# Patient Record
Sex: Male | Born: 1965 | Race: White | Hispanic: No | Marital: Single | Smoking: Former smoker
Health system: Southern US, Community
[De-identification: ages and names within clinical notes are randomized; demographics above are authoritative.]

## PROBLEM LIST (undated history)

## (undated) DIAGNOSIS — R079 Chest pain, unspecified: Secondary | ICD-10-CM

## (undated) HISTORY — DX: Chest pain, unspecified: R07.9

---

## 1999-09-22 ENCOUNTER — Encounter: Admission: RE | Admit: 1999-09-22 | Discharge: 1999-09-22 | Payer: Self-pay | Admitting: Internal Medicine

## 1999-09-22 ENCOUNTER — Encounter: Payer: Self-pay | Admitting: Internal Medicine

## 1999-12-17 ENCOUNTER — Emergency Department (HOSPITAL_COMMUNITY): Admission: EM | Admit: 1999-12-17 | Discharge: 1999-12-17 | Payer: Self-pay | Admitting: Emergency Medicine

## 2000-02-08 ENCOUNTER — Encounter: Admission: RE | Admit: 2000-02-08 | Discharge: 2000-02-08 | Payer: Self-pay | Admitting: Internal Medicine

## 2000-02-08 ENCOUNTER — Encounter: Payer: Self-pay | Admitting: Internal Medicine

## 2000-05-23 ENCOUNTER — Encounter: Admission: RE | Admit: 2000-05-23 | Discharge: 2000-05-23 | Payer: Self-pay | Admitting: Internal Medicine

## 2000-05-23 ENCOUNTER — Encounter: Payer: Self-pay | Admitting: Internal Medicine

## 2013-11-04 ENCOUNTER — Encounter (HOSPITAL_COMMUNITY): Payer: Self-pay | Admitting: Emergency Medicine

## 2013-11-04 ENCOUNTER — Emergency Department (HOSPITAL_COMMUNITY): Payer: Self-pay

## 2013-11-04 ENCOUNTER — Emergency Department (HOSPITAL_COMMUNITY)
Admission: EM | Admit: 2013-11-04 | Discharge: 2013-11-04 | Disposition: A | Discharge status: Home / Self Care | Payer: Self-pay | Attending: Emergency Medicine | Admitting: Emergency Medicine

## 2013-11-04 DIAGNOSIS — Y9389 Activity, other specified: Secondary | ICD-10-CM | POA: Insufficient documentation

## 2013-11-04 DIAGNOSIS — IMO0002 Reserved for concepts with insufficient information to code with codable children: Secondary | ICD-10-CM | POA: Insufficient documentation

## 2013-11-04 DIAGNOSIS — T148XXA Other injury of unspecified body region, initial encounter: Secondary | ICD-10-CM

## 2013-11-04 DIAGNOSIS — Y929 Unspecified place or not applicable: Secondary | ICD-10-CM | POA: Insufficient documentation

## 2013-11-04 DIAGNOSIS — X500XXA Overexertion from strenuous movement or load, initial encounter: Secondary | ICD-10-CM | POA: Insufficient documentation

## 2013-11-04 DIAGNOSIS — R079 Chest pain, unspecified: Secondary | ICD-10-CM

## 2013-11-04 DIAGNOSIS — R0602 Shortness of breath: Secondary | ICD-10-CM | POA: Insufficient documentation

## 2013-11-04 LAB — CBC WITH DIFFERENTIAL/PLATELET
BASOS PCT: 0 % (ref 0–1)
Basophils Absolute: 0 10*3/uL (ref 0.0–0.1)
EOS PCT: 0 % (ref 0–5)
Eosinophils Absolute: 0 10*3/uL (ref 0.0–0.7)
HCT: 44.2 % (ref 39.0–52.0)
Hemoglobin: 15.2 g/dL (ref 13.0–17.0)
LYMPHS ABS: 0.8 10*3/uL (ref 0.7–4.0)
Lymphocytes Relative: 12 % (ref 12–46)
MCH: 29.6 pg (ref 26.0–34.0)
MCHC: 34.4 g/dL (ref 30.0–36.0)
MCV: 86.2 fL (ref 78.0–100.0)
MONOS PCT: 6 % (ref 3–12)
Monocytes Absolute: 0.4 10*3/uL (ref 0.1–1.0)
Neutro Abs: 5 10*3/uL (ref 1.7–7.7)
Neutrophils Relative %: 82 % — ABNORMAL HIGH (ref 43–77)
Platelets: 191 10*3/uL (ref 150–400)
RBC: 5.13 MIL/uL (ref 4.22–5.81)
RDW: 12.7 % (ref 11.5–15.5)
WBC: 6.1 10*3/uL (ref 4.0–10.5)

## 2013-11-04 LAB — COMPREHENSIVE METABOLIC PANEL
ALBUMIN: 4.3 g/dL (ref 3.5–5.2)
ALT: 19 U/L (ref 0–53)
ANION GAP: 12 (ref 5–15)
AST: 21 U/L (ref 0–37)
Alkaline Phosphatase: 46 U/L (ref 39–117)
BUN: 14 mg/dL (ref 6–23)
CALCIUM: 9.7 mg/dL (ref 8.4–10.5)
CO2: 24 mEq/L (ref 19–32)
Chloride: 101 mEq/L (ref 96–112)
Creatinine, Ser: 0.82 mg/dL (ref 0.50–1.35)
GFR calc non Af Amer: >90 mL/min (ref >90)
GLUCOSE: 107 mg/dL — AB (ref 70–99)
Potassium: 4.1 mEq/L (ref 3.7–5.3)
Sodium: 137 mEq/L (ref 137–147)
TOTAL PROTEIN: 7.2 g/dL (ref 6.0–8.3)
Total Bilirubin: 0.6 mg/dL (ref 0.3–1.2)

## 2013-11-04 LAB — PRO B NATRIURETIC PEPTIDE: Pro B Natriuretic peptide (BNP): 34.3 pg/mL (ref 0–125)

## 2013-11-04 LAB — TROPONIN I: Troponin I: <0.3 ng/mL (ref <0.30)

## 2013-11-04 LAB — D-DIMER, QUANTITATIVE (NOT AT ARMC)

## 2013-11-04 MED ORDER — IBUPROFEN 600 MG PO TABS: 600.0000 mg | ORAL_TABLET | Freq: Four times a day (QID) | ORAL | Status: DC | PRN

## 2013-11-04 MED ORDER — METHOCARBAMOL 500 MG PO TABS: 500.0000 mg | ORAL_TABLET | Freq: Two times a day (BID) | ORAL | Status: DC | PRN

## 2013-11-04 MED ORDER — ASPIRIN 325 MG PO TABS
325.0000 mg | ORAL_TABLET | Freq: Once | ORAL | Status: AC
  Administered 2013-11-04: 325 mg via ORAL
  Filled 2013-11-04: qty 1

## 2013-11-04 MED ORDER — SODIUM CHLORIDE 0.9 % IV BOLUS (SEPSIS)
500.0000 mL | Freq: Once | INTRAVENOUS | Status: AC
  Administered 2013-11-04: 500 mL via INTRAVENOUS

## 2013-11-04 NOTE — Discharge Instructions (Signed)
Please call and set-up an appointment with Heart doctor and Health and wellness center to be re-assessed Please rest and stay hdyrated Please avoid any physical or strenuous activity  Please drink plenty of water Please take medications as prescribed - while on medications there is to be no drinking alcohol, driving, operating any heavy machinery. Robaxin is a muscle relaxer and can lead to drowsiness.  Please continue to monitor symptoms closely and if symptoms are to worsen or change (fever greater than 101, chills, chest pain, shortness of breath, difficulty breathing, worsening or changes to pain pattern, constant discomfort, fall, injury, tingling down the left arm, numbness, jaw pain, neck pain) please report back to the ED immediately  Chest Pain (Nonspecific) It is often hard to give a specific diagnosis for the cause of chest pain. There is always a chance that your pain could be related to something serious, such as a heart attack or a blood clot in the lungs. You need to follow up with your health care provider for further evaluation. CAUSES   Heartburn.  Pneumonia or bronchitis.  Anxiety or stress.  Inflammation around your heart (pericarditis) or lung (pleuritis or pleurisy).  A blood clot in the lung.  A collapsed lung (pneumothorax). It can develop suddenly on its own (spontaneous pneumothorax) or from trauma to the chest.  Shingles infection (herpes zoster virus). The chest wall is composed of bones, muscles, and cartilage. Any of these can be the source of the pain.  The bones can be bruised by injury.  The muscles or cartilage can be strained by coughing or overwork.  The cartilage can be affected by inflammation and become sore (costochondritis). DIAGNOSIS  Lab tests or other studies may be needed to find the cause of your pain. Your health care provider may have you take a test called an ambulatory electrocardiogram (ECG). An ECG records your heartbeat patterns  over a 24-hour period. You may also have other tests, such as:  Transthoracic echocardiogram (TTE). During echocardiography, sound waves are used to evaluate how blood flows through your heart.  Transesophageal echocardiogram (TEE).  Cardiac monitoring. This allows your health care provider to monitor your heart rate and rhythm in real time.  Holter monitor. This is a portable device that records your heartbeat and can help diagnose heart arrhythmias. It allows your health care provider to track your heart activity for several days, if needed.  Stress tests by exercise or by giving medicine that makes the heart beat faster. TREATMENT   Treatment depends on what may be causing your chest pain. Treatment may include:  Acid blockers for heartburn.  Anti-inflammatory medicine.  Pain medicine for inflammatory conditions.  Antibiotics if an infection is present.  You may be advised to change lifestyle habits. This includes stopping smoking and avoiding alcohol, caffeine, and chocolate.  You may be advised to keep your head raised (elevated) when sleeping. This reduces the chance of acid going backward from your stomach into your esophagus. Most of the time, nonspecific chest pain will improve within 2-3 days with rest and mild pain medicine.  HOME CARE INSTRUCTIONS   If antibiotics were prescribed, take them as directed. Finish them even if you start to feel better.  For the next few days, avoid physical activities that bring on chest pain. Continue physical activities as directed.  Do not use any tobacco products, including cigarettes, chewing tobacco, or electronic cigarettes.  Avoid drinking alcohol.  Only take medicine as directed by your health care provider.  Follow your health care provider's suggestions for further testing if your chest pain does not go away.  Keep any follow-up appointments you made. If you do not go to an appointment, you could develop lasting (chronic)  problems with pain. If there is any problem keeping an appointment, call to reschedule. SEEK MEDICAL CARE IF:   Your chest pain does not go away, even after treatment.  You have a rash with blisters on your chest.  You have a fever. SEEK IMMEDIATE MEDICAL CARE IF:   You have increased chest pain or pain that spreads to your arm, neck, jaw, back, or abdomen.  You have shortness of breath.  You have an increasing cough, or you cough up blood.  You have severe back or abdominal pain.  You feel nauseous or vomit.  You have severe weakness.  You faint.  You have chills. This is an emergency. Do not wait to see if the pain will go away. Get medical help at once. Call your local emergency services (911 in U.S.). Do not drive yourself to the hospital. MAKE SURE YOU:   Understand these instructions.  Will watch your condition.  Will get help right away if you are not doing well or get worse. Document Released: 01/12/2005 Document Revised: 04/09/2013 Document Reviewed: 11/08/2007 [facility] Patient Information 2015 [place], [place]. This information is not intended to replace advice given to you by your health care provider. Make sure you discuss any questions you have with your health care provider.  Muscle Pain Muscle pain (myalgia) may be caused by many things, including:  Overuse or muscle strain, especially if you are not in shape. This is the most common cause of muscle pain.  Injury.  Bruises.  Viruses, such as the flu.  Infectious diseases.  Fibromyalgia, which is a chronic condition that causes muscle tenderness, fatigue, and headache.  Autoimmune diseases, including lupus.  Certain drugs, including ACE inhibitors and statins. Muscle pain may be mild or severe. In most cases, the pain lasts only a short time and goes away without treatment. To diagnose the cause of your muscle pain, your health care provider will take your medical history. This means he or she will  ask you when your muscle pain began and what has been happening. If you have not had muscle pain for very long, your health care provider may want to wait before doing much testing. If your muscle pain has lasted a long time, your health care provider may want to run tests right away. If your health care provider thinks your muscle pain may be caused by illness, you may need to have additional tests to rule out certain conditions.  Treatment for muscle pain depends on the cause. Home care is often enough to relieve muscle pain. Your health care provider may also prescribe anti-inflammatory medicine. HOME CARE INSTRUCTIONS Watch your condition for any changes. The following actions may help to lessen any discomfort you are feeling:  Only take over-the-counter or prescription medicines as directed by your health care provider.  Apply ice to the sore muscle:  Put ice in a plastic bag.  Place a towel between your skin and the bag.  Leave the ice on for 15-20 minutes, 3-4 times a day.  You may alternate applying hot and cold packs to the muscle as directed by your health care provider.  If overuse is causing your muscle pain, slow down your activities until the pain goes away.  Remember that it is normal to feel some  muscle pain after starting a workout program. Muscles that have not been used often will be sore at first.  Do regular, gentle exercises if you are not usually active.  Warm up before exercising to lower your risk of muscle pain.  Do not continue working out if the pain is very bad. Bad pain could mean you have injured a muscle. SEEK MEDICAL CARE IF:  Your muscle pain gets worse, and medicines do not help.  You have muscle pain that lasts longer than 3 days.  You have a rash or fever along with muscle pain.  You have muscle pain after a tick bite.  You have muscle pain while working out, even though you are in good physical condition.  You have redness, soreness, or  swelling along with muscle pain.  You have muscle pain after starting a new medicine or changing the dose of a medicine. SEEK IMMEDIATE MEDICAL CARE IF:  You have trouble breathing.  You have trouble swallowing.  You have muscle pain along with a stiff neck, fever, and vomiting.  You have severe muscle weakness or cannot move part of your body. MAKE SURE YOU:   Understand these instructions.  Will watch your condition.  Will get help right away if you are not doing well or get worse. Document Released: 02/24/2006 Document Revised: 04/09/2013 Document Reviewed: 01/29/2013 [facility] Patient Information 2015 [place], [place]. This information is not intended to replace advice given to you by your health care provider. Make sure you discuss any questions you have with your health care provider.   Emergency Department Resource Guide 1) Find a Doctor and Pay Out of Pocket Although you won't have to find out who is covered by your insurance plan, it is a good idea to ask around and get recommendations. You will then need to call the office and see if the doctor you have chosen will accept you as a new patient and what types of options they offer for patients who are self-pay. Some doctors offer discounts or will set up payment plans for their patients who do not have insurance, but you will need to ask so you aren't surprised when you get to your appointment.  2) Contact Your Local Health Department Not all health departments have doctors that can see patients for sick visits, but many do, so it is worth a call to see if yours does. If you don't know where your local health department is, you can check in your phone book. The CDC also has a tool to help you locate your state's health department, and many state websites also have listings of all of their local health departments.  3) Find a Walk-in Clinic If your illness is not likely to be very severe or complicated, you may want to try a walk  in clinic. These are popping up all over the country in pharmacies, drugstores, and shopping centers. They're usually staffed by nurse practitioners or physician assistants that have been trained to treat common illnesses and complaints. They're usually fairly quick and inexpensive. However, if you have serious medical issues or chronic medical problems, these are probably not your best option.  No Primary Care Doctor: - Call Health Connect at  [phone] - they can help you locate a primary care doctor that  accepts your insurance, provides certain services, etc. - Physician Referral Service- [phone]  Chronic Pain Problems: Organization         Address  Phone   Notes  [facility]  [phone] Patients need to be referred by their primary care doctor.   Medication Assistance: Organization         Address  Phone   Notes  [facility] Medication [facility] [address] [place], [place] [address] [phone] --Must be a resident of [facility] -- Must have NO insurance coverage whatsoever (no Medicaid/ Medicare, etc.) -- The pt. MUST have a primary care doctor that directs their care regularly and follows them in the community   [facility]  [phone]   [employer]  [phone]    Agencies that provide inexpensive medical care: Organization         Address  Phone   Notes  [facility]  [phone]   [facility]    [phone]   [facility] [address] [place], [place] [address] [phone]   [facility] [address] [place], [place] [phone]   Planned Parenthood    [phone]   [facility]    [phone]   Community [facility] and [facility]  [address], [place] Phone:  [phone], Fax:  [phone] Hours of Operation:  9 am - 6 pm, M-F.  Also accepts Medicaid/Medicare and self-pay.  [facility]  [address], [place] Phone: [phone], Fax: [phone]. Hours of Operation:  8:30 am - 5:30 pm, M-F.  Also accepts Medicaid and self-pay.  [facility] [place] [address] [place] Phone: [phone]   [facility] [address] Natasha Bence [place], [place] [phone], Ext. [phone] Mondays & Thursdays: 7-9 AM.  First 15 patients are seen on a first come, first serve basis.    Medicaid-accepting [facility] Providers:  Organization         Address  Phone   Notes  [facility] [address], [place] [phone] Also accepts self-pay patients.  [facility] [address] Laurell Josephs [place], [place]  [phone]   [facility] [address], [place] [phone]   [facility] [address], [place] [phone]   Renaye Rakers [address], [place]   [phone] Only accepts [place] patients after they have their name applied to their card.   Self-Pay (no insurance) in [facility]:  Organization         Address  Phone   Notes  Sickle Cell Patients, [facility] Internal Medicine [address] [place], [place] [phone]   [facility] Urgent Care [address] [place], [place] [phone]   [facility] [place]  [address], [place] [phone]   [facility]/Dr. Osei-Bonsu  [address] [place] or [address], [place] [phone] Phone number for both [place] and [place] locations is the same.  [facility] [address], [place] [phone]   [facility] [address] [place] or [address] [place] [address] [phone]   [facility] [address], [place] [phone], phone; [phone], fax Sees  patients 1st and 3rd Saturday of every month.  Must not qualify for public or private insurance (i.e. Medicaid, Medicare, [place] Health Choice,  Veterans' Benefits)  Household income should be no more than 200% of the poverty level The clinic cannot treat you if you are pregnant or think you are pregnant  Sexually transmitted diseases are not treated at the clinic.    Dental Care: Organization         Address  Phone  Notes  [place] [facility] [address], [place] [phone] Accepts children up to age 221 who are enrolled in [place] or [place] Health Choice; pregnant women with a Medicaid card; and children who have applied for Medicaid or [place] Health Choice, but were declined, whose parents can pay a reduced fee at time of service.  [facility] [place]  [address], [place] [phone] Accepts children up to age 47 who are enrolled in [place] or [place] Health Choice; pregnant women with a Medicaid card; and children who have applied for Medicaid or [place] Health Choice, but were declined, whose parents can pay a reduced fee at time of service.  [facility]  [address], [place] (3[phone] Patients are seen by appointment only. Walk-ins are not accepted. [facility] will see patients 47 years of age and older. Monday - Tuesday (8am-5pm) Most Wednesdays (8:30-5pm) $30 per visit, cash only  [facility]  [address], [facility] [phone] Patients are seen by appointment only. Walk-ins are not accepted. [facility] will see patients 47 years of age and older. One Wednesday Evening (Monthly: Volunteer Based).  $30 per visit, cash only  [employer] [facility] [facility]  [phone] for adults; Children under age 804, call Graduate Pediatric Dentistry at [phone]. Children aged 144-14, please call [phone] to request a  pediatric application.  Dental services are provided in all areas of dental care including fillings, crowns and bridges, complete and partial dentures, implants, gum treatment, root canals, and extractions. Preventive care is also provided. Treatment is provided to both adults and children. Patients are selected via a lottery and there is often a waiting list.   [facility] [address], [place]  [phone] [email or web address]   [facility] [address], [place], [place] [phone], Ext. [phone] Second and Fourth Thursday of each month, opens at 6:30 AM; Clinic ends at 9 AM.  Patients are seen on a first-come first-served basis, and a limited number are seen during each clinic.   [facility]  [address], [place], [place] [phone]   Eligibility Requirements You must have lived in [place], [place], or [place] counties for at least the last three months.   You cannot be eligible for state or federal sponsored [place] Cityhealthcare insurance, including CIGNAVeterans Administration, [place]icaid, or [employer]icare.   You generally cannot be eligible for healthcare insurance through your employer.    How to apply: Eligibility screenings are held every Tuesday and Wednesday afternoon from 1:00 pm until 4:00 pm. You do not need an appointment for the interview!  [facility] [address], [place], [place] [phone]   [facility]  [phone]   [facility]  [phone]   [facility]  [phone]    Behavioral Health Resources in the Community: Intensive Outpatient Programs Organization         Address  Phone  Notes  [facility] [address],  [place], [place] [phone]   [facility] Outpatient [address], [place], [place] [phone]   [facility] [address] [address] [place], [place]  [phone]   [facility]  Mental Health [address]  [place], [place] [phone] or [phone]   Substance Abuse Resources Organization         Address  Phone  Notes  [facility] Drug Services  4126119350   [facility]  [phone]   [facility]  5407740339   Floydene Flock  [phone]   Residential & Outpatient Substance Abuse Program  [phone]   Psychological Services Organization         Address  Phone  Notes  [facility] Behavioral Health  [phone]   [facility]  [phone]   [facility] [address] [place] [phone] or [phone]    Mobile Crisis Teams Organization         Address  Phone  Notes  [facility], Mobile Crisis Care Unit  [phone]   [facility]  [address] [place], [place] [phone]   Doristine Locks [address] [place] [phone]    Self-Help/Support Groups Organization         Address  Phone             Notes  [facility] [place] - variety of support groups  336- I7437963 Call for more information  Narcotics Anonymous (NA), [facility]aring [facility] [address], [employer] [place]  2 meetings at this location   Statistician         Address  Phone  Notes  [facility] [address],    [place]  [phone]   [facility]  [address] [place] [address], [place], [place] [phone]   [facility] [address] [place], [place] [phone] Admissions: 8am-3pm M-F  [facility] [address]    [place], [place] [phone]   [facility] [address] [place], [place], [place] [phone]   [facility] [address],  [place], [place] [phone]   [facility] - Intensive Outpatient [address] Laurell Josephs [address], [place], [place] [phone]   [facility] ([facility]iction [facility] [address] [place]    [place], [place] [phone] or [phone]   [facility] ([facility]) [address] [place], [place] [phone] Accepts Medicaid  [facility] [address],  [place] [phone] Substance Abuse/Addiction Treatment   [facility] Organization         Address  Phone  Notes  [facility]  [phone]   Angie Fava, PhD [address] Ervin Knack [place], [place]   [phone] or [phone]   [facility]   [address] [place], [place] [phone]   [facility] [address] [place], [place] [phone] Insurance/Medicaid/sponsorship through [facility] Families [address]                                    [place], [place] [phone] Therapy/tele-psych/case  [facility] [address], [place] [phone]    Dr. Lolly Mustache  [phone]   [facility]  [employer] [facility] Dept. 1) [address], [address]  2) [place] 3)  [place], [place] [phone]  [phone]   [facility] Child Abuse Hotline 210-597-2012 or [phone] (After Hours)

## 2013-11-04 NOTE — ED Notes (Signed)
Pt [place] that [place] and reached to open door and had sharp stabbing pain in left side of chest. Pt thought it could be muscle related so he took an old muscle relaxer that he had. Pt states pain has eased off but still there. States he works a lot out in the heat and doesn't know if it could be related to dehydration.

## 2013-11-04 NOTE — Progress Notes (Signed)
  CARE MANAGEMENT ED NOTE 11/04/2013  Patient:  Travis Vazquez,Travis Vazquez   Account Number:  [id number]  Date Initiated:  11/04/2013  Documentation initiated by:  Radford PaxFERRERO,Lorre Opdahl  Subjective/Objective Assessment:   Patient presents to Ed with left sided chest pain     Subjective/Objective Assessment Detail:     Action/Plan:   Action/Plan Detail:   Anticipated DC Date:  11/04/2013     Status Recommendation to Physician:   Result of Recommendation:    Other ED Services  Consult Working Plan    DC Planning Services  CM consult  Other  PCP issues    Choice offered to / List presented to:            Status of service:  Completed, signed off  ED Comments:   ED Comments Detail:  EDCM spoke to patient and family member at bedside. Patient confrms he does not have a pcp.  Patient's family member reports he is trying to get patient an appointment with his doctor, Travis Vazquez.  [facility]  [facility] 9[facility]-1030am.  [facility] informed [facility] that he may receive asistance with his medications, speak to a Artistfinancial counselor or Child psychotherapistsocial worker and enroll [facility] the orange [facility].  EDCM also provided patient with list of pcps who accept self pay patients, list of discounted pahrmacies and websites needymeds.org and Good https://figueroa.info/X.com for medication assistance, contact information regarding Affordable Care Act and DSS for Medicaid for insurnace, and dental assistance for patients without insurance.  Patient reports his dentist is right down the street from him and has been seeing him for a year and a half.

## 2013-11-04 NOTE — ED Notes (Addendum)
Pt denies pain at present time. Pt reports pain only with twist of certain movements. PA at bedside and aware of all. Pt comfortable and resting.

## 2013-11-04 NOTE — Progress Notes (Signed)
[facility] provided pt with a list of primary care resources and a [facility] Orange Card to help patient establish primary care.  °

## 2013-11-04 NOTE — ED Provider Notes (Signed)
CSN: [id number]     Arrival date & time 11/04/13  1013 History   First MD Initiated Contact with Patient 11/04/13 1032     Chief Complaint  Patient presents with  . Chest Pain     (Consider location/radiation/quality/duration/timing/severity/associated sxs/prior Treatment) The history is provided by the patient. No language interpreter was used.  Travis Vazquez is a 48 y/o M with no known significant PMHx presenting to the ED with chest pain that started this morning. Patient reported that as he was going to grab an object with his left hand stated that he felt a sudden sharp, stabbing pain to the left side of his chest and stated that the pain lasted for approximately 5-10 minutes, denied radiation of pain. Stated that when he was experiencing the discomfort he had shortness of breath associated with it. Stated that he took a muscle relaxer - believes it to be Robaxin and stated that the discomfort has improved. Patient reported that the pain has subsided, but stated that he has been having a pulling, soreness sensation to the left side of the chest that occurs with motion. Patient reported that he used to dip tobacco, but stopped on October 16, 2013. Stated that he stopped drinking soft drinks approximately 3 weeks ago, but stated that he has not been staying hydrated like he should. 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 Patient reported that he has travels frequently - stated that he has been to [place] without breaks a couple of times over the past year. As per patient, reported that he's been experiencing shortness of breath upon exertion but has been increasing over the past year. Stated that he has noticed an abnormal rhythm in his heart when he is laying down in bed over the past year. Stated that he has not been to a physician in a while. Stated that his father has history of 3 stents placed, but is unable to recall when they were placed and his age at that time. Denied difficulty  breathing, fever, chills, cough, cigarette, leg swelling. PCP none  History reviewed. No pertinent past medical history. History reviewed. No pertinent past surgical history. No family history on file. History  Substance Use Topics  . Smoking status: Never Smoker   . Smokeless tobacco: Former Neurosurgeon  . Alcohol Use: Not on file    Review of Systems  Constitutional: Negative for fever, chills and diaphoresis.  Respiratory: Positive for shortness of breath. Negative for chest tightness.   Cardiovascular: Positive for chest pain. Negative for leg swelling.  Gastrointestinal: Negative for nausea, vomiting and abdominal pain.  Neurological: Negative for dizziness, weakness and numbness.      Allergies  Hydrocodone  Home Medications   Prior to Admission medications   Medication Sig Start Date End Date Taking? Authorizing Provider  methocarbamol (ROBAXIN) 750 MG tablet Take 750 mg by mouth every 6 (six) hours as needed for muscle spasms.   Yes Historical Provider, MD  ibuprofen (ADVIL,MOTRIN) 600 MG tablet Take 1 tablet (600 mg total) by mouth every 6 (six) hours as needed. 11/04/13   Richerd Grime, PA-C  methocarbamol (ROBAXIN) 500 MG tablet Take 1 tablet (500 mg total) by mouth every 12 (twelve) hours as needed for muscle spasms. 11/04/13   Bryanah Sidell, PA-C   BP 108/77  Pulse 57  Temp(Src) 97.6 F (36.4 C) (Oral)  Resp 17  Ht 6\' 3"  (1.905 m)  Wt 169 lb 6 oz (76.828 kg)  BMI 21.17 kg/m2  SpO2 99% Physical Exam  Nursing note and vitals reviewed. Constitutional: He is oriented to person, place, and time. He appears well-developed and well-nourished. No distress.  HENT:  Head: Normocephalic and atraumatic.  Eyes: Conjunctivae and EOM are normal. Pupils are equal, round, and reactive to light. Right eye exhibits no discharge. Left eye exhibits no discharge.  Neck: Normal range of motion. Neck supple. No tracheal deviation present.  Negative neck stiffness Negative nuchal  rigidity Negative cervical lymphadenopathy Negative meningeal signs  Cardiovascular: Normal rate, regular rhythm and normal heart sounds.  Exam reveals no friction rub.   No murmur heard. Pulses:      Radial pulses are 2+ on the right side, and 2+ on the left side.  Cap refill less than 3 seconds Negative swelling or pitting edema identified to the lower extremities bilaterally  Pulmonary/Chest: Effort normal and breath sounds normal. No respiratory distress. He has no wheezes. He has no rales. He exhibits tenderness.    Patient is able to speak in full sentences without difficulty Negative use of accessory muscles Negative stridor Negative deformities or abnormalities identified to the chest wall-negative ecchymosis Discomfort upon palpation to the left side of the chest-pain reproducible upon palpation  Musculoskeletal: Normal range of motion.  Full ROM to upper and lower extremities without difficulty noted, negative ataxia noted.  Lymphadenopathy:    He has no cervical adenopathy.  Neurological: He is alert and oriented to person, place, and time. No cranial nerve deficit. He exhibits normal muscle tone. Coordination normal.  Cranial nerves III-XII grossly intact Strength 5+/5+ to upper and lower extremities bilaterally with resistance applied, equal distribution noted Equal grip strength bilaterally Negative facial drooping Negative slurred speech Negative aphasia GCS 15 Gait proper, proper balance - negative sway, negative drift, negative step-offs  Skin: Skin is warm and dry. No rash noted. He is not diaphoretic. No erythema.  Psychiatric: He has a normal mood and affect. His behavior is normal. Thought content normal.    ED Course  Procedures (including critical care time)  1:17 PM This provider re-assessed the patient. Patient reported that there is some mild discomfort to the left side of the chest only with motion described as a a pulling sensation.   Results for  orders placed during the hospital encounter of 11/04/13  PRO B NATRIURETIC PEPTIDE      Result Value Ref Range   Pro B Natriuretic peptide (BNP) 34.3  0 - 125 pg/mL  CBC WITH DIFFERENTIAL      Result Value Ref Range   WBC 6.1  4.0 - 10.5 K/uL   RBC 5.13  4.22 - 5.81 MIL/uL   Hemoglobin 15.2  13.0 - 17.0 g/dL   HCT 16.1  09.6 - 04.5 %   MCV 86.2  78.0 - 100.0 fL   MCH 29.6  26.0 - 34.0 pg   MCHC 34.4  30.0 - 36.0 g/dL   RDW 40.9  81.1 - 91.4 %   Platelets 191  150 - 400 K/uL   Neutrophils Relative % 82 (*) 43 - 77 %   Neutro Abs 5.0  1.7 - 7.7 K/uL   Lymphocytes Relative 12  12 - 46 %   Lymphs Abs 0.8  0.7 - 4.0 K/uL   Monocytes Relative 6  3 - 12 %   Monocytes Absolute 0.4  0.1 - 1.0 K/uL   Eosinophils Relative 0  0 - 5 %   Eosinophils Absolute 0.0  0.0 - 0.7 K/uL   Basophils Relative  0  0 - 1 %   Basophils Absolute 0.0  0.0 - 0.1 K/uL  COMPREHENSIVE METABOLIC PANEL      Result Value Ref Range   Sodium 137  137 - 147 mEq/L   Potassium 4.1  3.7 - 5.3 mEq/L   Chloride 101  96 - 112 mEq/L   CO2 24  19 - 32 mEq/L   Glucose, Bld 107 (*) 70 - 99 mg/dL   BUN 14  6 - 23 mg/dL   Creatinine, Ser 1.61  0.50 - 1.35 mg/dL   Calcium 9.7  8.4 - 09.6 mg/dL   Total Protein 7.2  6.0 - 8.3 g/dL   Albumin 4.3  3.5 - 5.2 g/dL   AST 21  0 - 37 U/L   ALT 19  0 - 53 U/L   Alkaline Phosphatase 46  39 - 117 U/L   Total Bilirubin 0.6  0.3 - 1.2 mg/dL   GFR calc non Af Amer >90  >90 mL/min   GFR calc Af Amer >90  >90 mL/min   Anion gap 12  5 - 15  TROPONIN I      Result Value Ref Range   Troponin I <0.30  <0.30 ng/mL  D-DIMER, QUANTITATIVE      Result Value Ref Range   D-Dimer, Quant <0.27  0.00 - 0.48 ug/mL-FEU  TROPONIN I      Result Value Ref Range   Troponin I <0.30  <0.30 ng/mL    Labs Review Labs Reviewed  CBC WITH DIFFERENTIAL - Abnormal; Notable for the following:    Neutrophils Relative % 82 (*)    All other components within normal limits  COMPREHENSIVE METABOLIC PANEL -  Abnormal; Notable for the following:    Glucose, Bld 107 (*)    All other components within normal limits  PRO B NATRIURETIC PEPTIDE  TROPONIN I  D-DIMER, QUANTITATIVE  TROPONIN I    Imaging Review Dg Chest 2 View  11/04/2013   CLINICAL DATA:  Left-sided chest pain  EXAM: CHEST  2 VIEW  COMPARISON:  None.  FINDINGS: The heart size and mediastinal contours are within normal limits. Both lungs are clear. The visualized skeletal structures are unremarkable.  IMPRESSION: No active cardiopulmonary disease.   Electronically Signed   By: Christiana Pellant M.D.   On: 11/04/2013 10:50     EKG Interpretation   Date/Time:  Monday November 04 2013 10:22:29 EDT Ventricular Rate:  64 PR Interval:  130 QRS Duration: 100 QT Interval:  402 QTC Calculation: 415 R Axis:   95 Text Interpretation:  Sinus rhythm Consider right ventricular hypertrophy  No previous ECGs available Confirmed by YAO  MD, DAVID ([id number]) on  11/04/2013 10:51:02 AM      MDM   Final diagnoses:  Chest pain, unspecified chest pain type  Muscle strain    Medications  aspirin tablet 325 mg (325 mg Oral Given 11/04/13 1132)  sodium chloride 0.9 % bolus 500 mL (0 mLs Intravenous Stopped 11/04/13 1205)   Filed Vitals:   11/04/13 1400 11/04/13 1500 11/04/13 1600 11/04/13 1632  BP: 123/72 112/66 108/77   Pulse: 60 66 57   Temp:    97.6 F (36.4 C)  TempSrc:    Oral  Resp: 18 17 17    Height:      Weight:      SpO2: 100% 100% 99%    EKG noted normal sinus rhythm with heart rate of 64 beats per minutes. Troponin negative elevation. Second troponin negative  elevation. D-dimer negative elevation. BNP negative elevation-34.3. CBC negative elevated white blood cell count-negative left shift or leukocytosis noted. Hemoglobin 15.2, hematocrit 44.2. CMP negative findings-BUN 14, creatinine 0.82. Negative elevated AST, ALT, alkaline phosphatase bilirubin. Glucose 107 with an anion gap of 12.0 mEq per liter. Chest x-ray negative for acute  cardiopulmonary disease-both lungs are clear, visualized skeletal structures are unremarkable. Heart size and mediastinal contours are within normal limits.  Doubt CHF. Doubt cardiac issue. Doubt PE. Suspicion to be muscular pain secondary to pain upon palpation and pain with motion. HEART score 2 - patient does meet inpatient requirements. Negative signs of respiratory distress. Patient stable, afebrile. Patient not septic appearing. Discharged patient. Discussed with patient to rest and stay hydrated. Discharged patient with muscle relaxers and anti-inflammatories. Case management saw and assessed patient - discussed Discussed with patient to closely monitor symptoms and if symptoms are to worsen or change to report back to the ED - strict return instructions given.  Patient agreed to plan of care, understood, all questions answered.   Raymon MuttonMarissa Copeland Neisen, PA-C 11/04/13 [phone]

## 2013-11-04 NOTE — ED Notes (Addendum)
Pt reports cramp to left side of chest starting this am post reaching for door. At present time pt reports no pain but pain with deep breath. Pt reports has not been seen by MD for 10 years and unsure of current medical hx due to such.

## 2013-11-04 NOTE — ED Notes (Signed)
Pt denies pain at present time but continues to reports slight pain with deep breath. Pt father at bedside.

## 2013-11-05 NOTE — ED Provider Notes (Signed)
Medical screening examination/treatment/procedure(s) were performed by non-physician practitioner and as supervising physician I was immediately available for consultation/collaboration.   EKG Interpretation   Date/Time:  Monday November 04 2013 10:22:29 EDT Ventricular Rate:  64 PR Interval:  130 QRS Duration: 100 QT Interval:  402 QTC Calculation: 415 R Axis:   95 Text Interpretation:  Sinus rhythm Consider right ventricular hypertrophy  No previous ECGs available Confirmed by YAO  MD, DAVID ([id number]) on  11/04/2013 10:51:02 AM        Richardean Canalavid H Yao, MD 11/05/13 1455

## 2015-05-15 IMAGING — CR DG CHEST 2V
3 series · 3 of 3 positions shown · non-contrast
Comparison: None.

CLINICAL DATA: Left-sided chest pain

EXAM:
CHEST  2 VIEW

[w chest pa (1 of 2)]
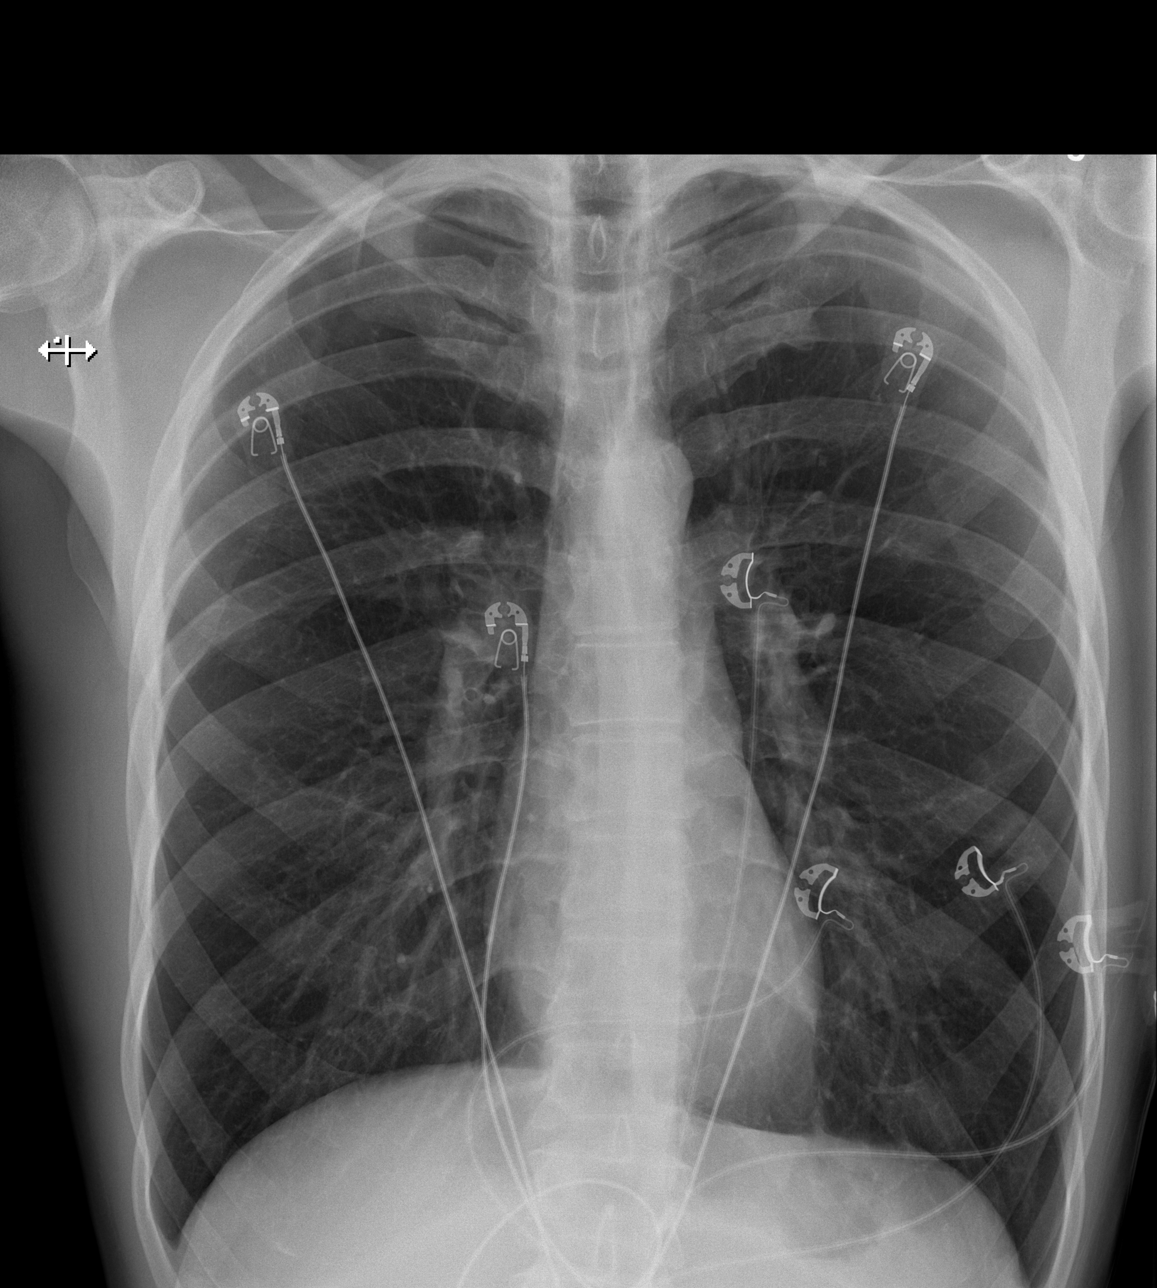

[w chest pa (2 of 2)]
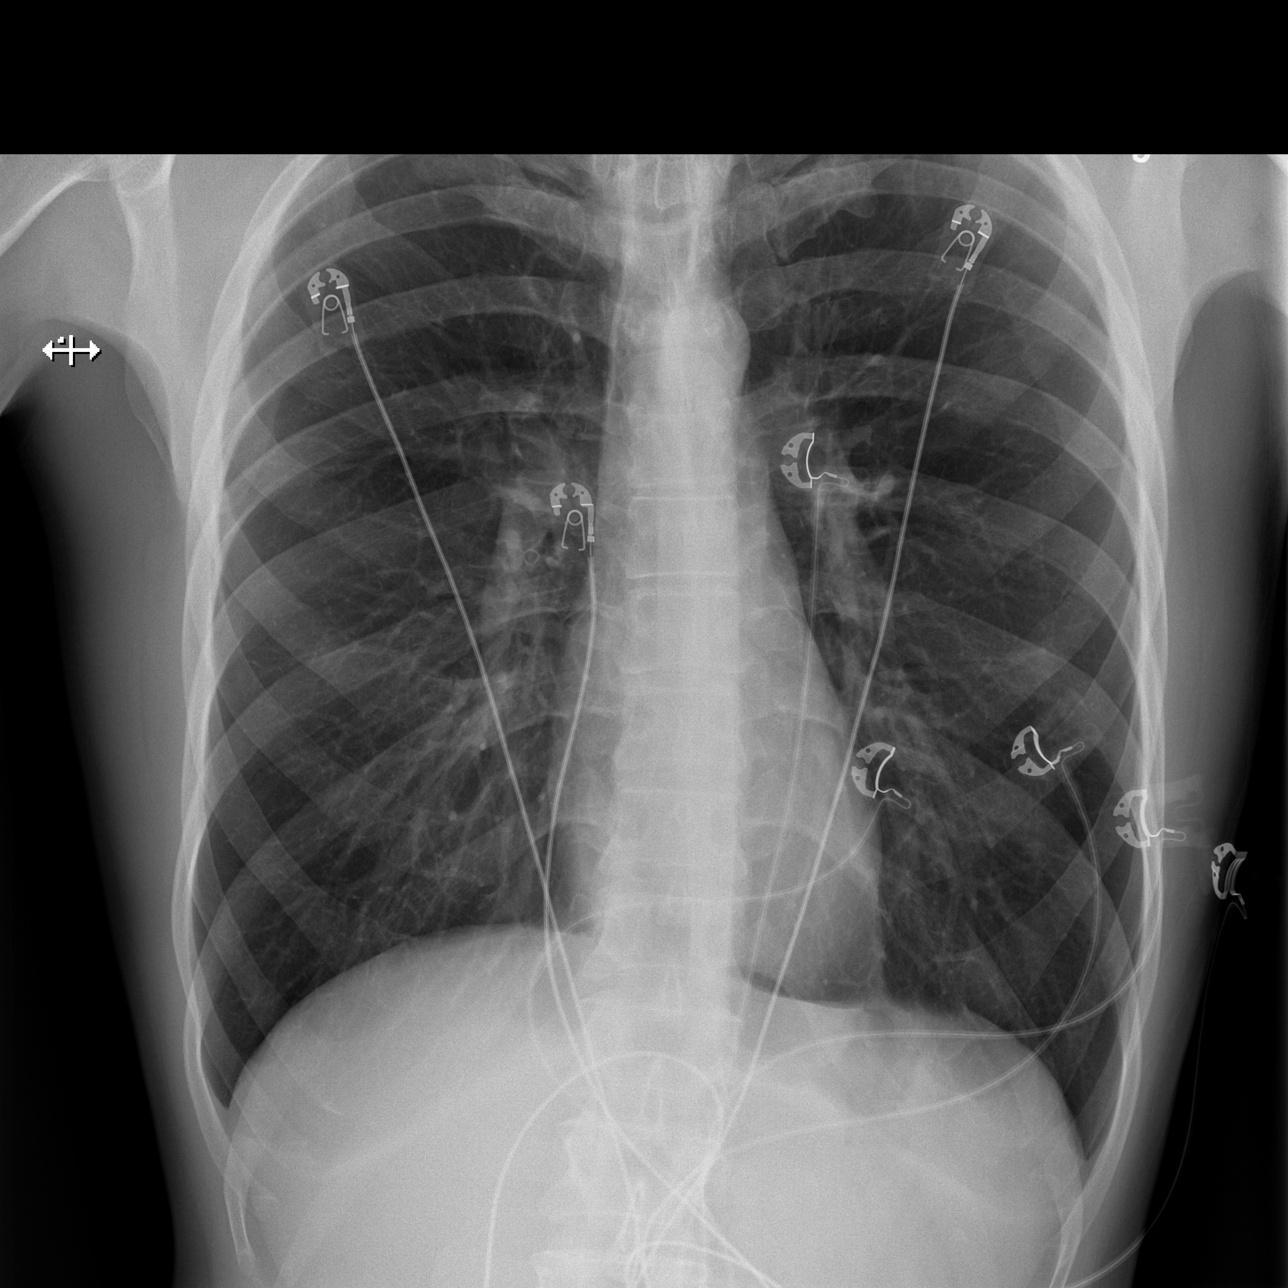

[w chest lat]
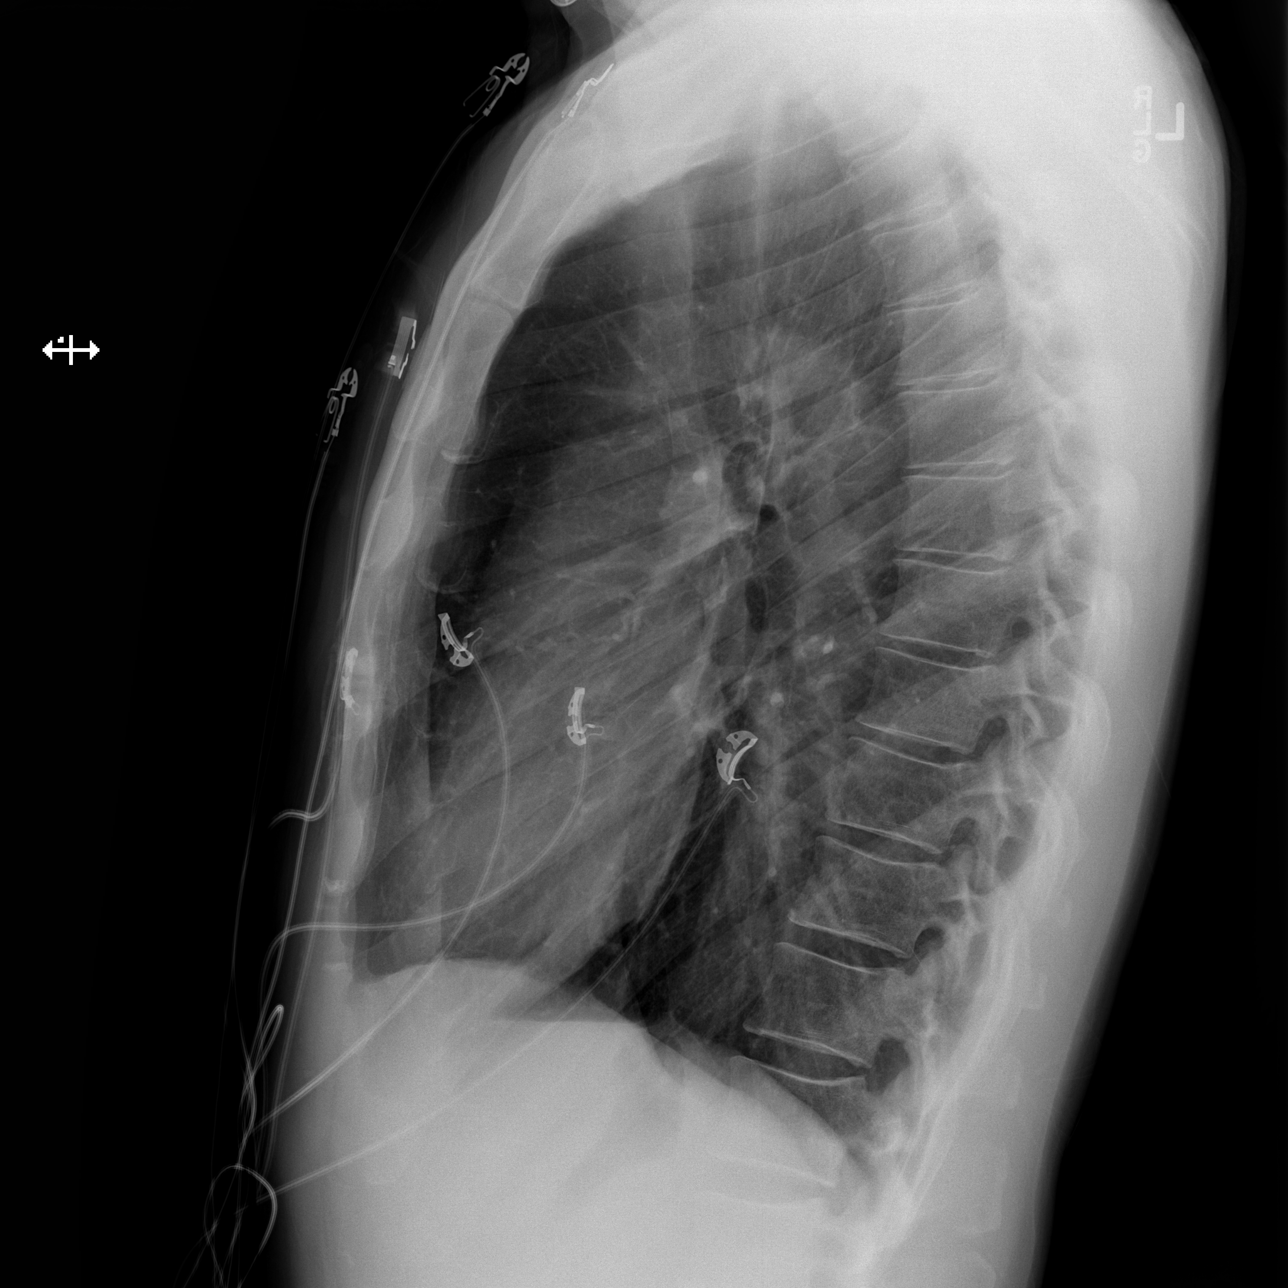

[3 of 3 positions shown; findings below may reference images not displayed]

FINDINGS: The heart size and mediastinal contours are within normal limits.
Both lungs are clear. The visualized skeletal structures are
unremarkable.
IMPRESSION: No active cardiopulmonary disease.

## 2017-11-23 DIAGNOSIS — R0609 Other forms of dyspnea: Secondary | ICD-10-CM | POA: Diagnosis not present

## 2017-11-23 DIAGNOSIS — K219 Gastro-esophageal reflux disease without esophagitis: Secondary | ICD-10-CM | POA: Diagnosis not present

## 2017-11-23 DIAGNOSIS — Z0000 Encounter for general adult medical examination without abnormal findings: Secondary | ICD-10-CM | POA: Diagnosis not present

## 2017-11-23 DIAGNOSIS — J40 Bronchitis, not specified as acute or chronic: Secondary | ICD-10-CM | POA: Diagnosis not present

## 2017-12-06 DIAGNOSIS — Z0000 Encounter for general adult medical examination without abnormal findings: Secondary | ICD-10-CM | POA: Diagnosis not present

## 2018-06-19 ENCOUNTER — Ambulatory Visit: Payer: 59 | Admitting: Cardiology

## 2018-06-19 ENCOUNTER — Encounter: Payer: Self-pay | Admitting: Cardiology

## 2018-06-19 VITALS — BP 110/71 | HR 62 | Ht 75.0 in | Wt 173.0 lb

## 2018-06-19 DIAGNOSIS — R0789 Other chest pain: Secondary | ICD-10-CM

## 2018-06-19 DIAGNOSIS — R0609 Other forms of dyspnea: Secondary | ICD-10-CM | POA: Diagnosis not present

## 2018-06-19 DIAGNOSIS — Z8249 Family history of ischemic heart disease and other diseases of the circulatory system: Secondary | ICD-10-CM | POA: Diagnosis not present

## 2018-06-19 DIAGNOSIS — I340 Nonrheumatic mitral (valve) insufficiency: Secondary | ICD-10-CM | POA: Diagnosis not present

## 2018-06-19 DIAGNOSIS — R0600 Dyspnea, unspecified: Secondary | ICD-10-CM

## 2018-06-19 DIAGNOSIS — R12 Heartburn: Secondary | ICD-10-CM

## 2018-06-19 NOTE — Progress Notes (Signed)
Subjective:   Travis Vazquez, male    DOB: 1965/11/07, 53 y.o.   MRN: [id number]  No primary care provider on file.:  Chief Complaint  Patient presents with  . Chest Pain    F/U    HPI: Travis Vazquez  is a 53 y.o. male  with tobacco abuse, family history of early coronary artery disease, seen for exertional chest pain and dyspnea.  Echocardiogram in Oct 2019  showed EF 55%, mild prolapse with moderate mitral regurgitation, aneurysmal interatrial septum without PFO. Exercise nuclear stress test showed excellent exercise capacity and no evidence of ischemia or infarction.   Seen by [place] about 3-4 months ago for chest pain, since then he has not had any chest pain until 3 days ago he started noticing chest tightness in the middle of the chest.  He noticed more episodes immediately after eating, would feel like sometimes the food is getting stuck in the middle of the chest and sometimes would feel burning sensation in his chest.  He did physical activity and had no significant relationship.  Dyspnea on exertion persist, states that it has been stable since he was seen last time, only noted during exertional activities but states that this is remained stable.  No PND or orthopnea.  No leg edema, no acute dyspnea, no hemoptysis, no cough.  No painful swelling of the lower extremity.    Past Medical History:  Diagnosis Date  . Chest pain     History reviewed. No pertinent surgical history.  Family History  Problem Relation Age of Onset  . Asthma Mother   . Hepatitis C Father   . Heart block Father   . Breast cancer Sister     Social History   Socioeconomic History  . Marital status: Single    Spouse name: Not on file  . Number of children: Not on file  . Years of education: Not on file  . Highest education level: Not on file  Occupational History  . Not on file  Social Needs  . Financial resource strain: Not on file  . Food insecurity:    Worry: Not on file   Inability: Not on file  . Transportation needs:    Medical: Not on file    Non-medical: Not on file  Tobacco Use  . Smoking status: Former Smoker    Packs/day: 0.50    Years: 6.00    Pack years: 3.00    Types: Cigarettes  . Smokeless tobacco: Current User    Types: Chew  Substance and Sexual Activity  . Alcohol use: Never    Frequency: Never  . Drug use: Never  . Sexual activity: Not on file  Lifestyle  . Physical activity:    Days per week: Not on file    Minutes per session: Not on file  . Stress: Not on file  Relationships  . Social connections:    Talks on phone: Not on file    Gets together: Not on file    Attends religious service: Not on file    Active member of club or organization: Not on file    Attends meetings of clubs or organizations: Not on file    Relationship status: Not on file  . Intimate partner violence:    Fear of current or ex partner: Not on file    Emotionally abused: Not on file    Physically abused: Not on file    Forced sexual activity: Not on file  Other  Topics Concern  . Not on file  Social History Narrative  . Not on file    Current Meds  Medication Sig  . ibuprofen (ADVIL,MOTRIN) 600 MG tablet Take 1 tablet (600 mg total) by mouth every 6 (six) hours as needed.  . Multiple Vitamin (MULTIVITAMIN) capsule Take 1 capsule by mouth daily.  . pantoprazole (PROTONIX) 40 MG tablet Take 1 tablet by mouth as needed.     Review of Systems  Constitution: Negative for chills, decreased appetite, malaise/fatigue and weight gain.  Cardiovascular: Negative for dyspnea on exertion, leg swelling and syncope.  Endocrine: Negative for cold intolerance.  Hematologic/Lymphatic: Does not bruise/bleed easily.  Musculoskeletal: Negative for joint swelling.  Gastrointestinal: Negative for abdominal pain, anorexia and change in bowel habit.  Neurological: Negative for headaches and light-headedness.  Psychiatric/Behavioral: Negative for depression and  substance abuse.  All other systems reviewed and are negative.      Objective:     Blood pressure 110/71, pulse 62, height '6\' 3"'  (1.905 m), weight 173 lb (78.5 kg), SpO2 98 %.  Exercise sestamibi stress test 01/05/2018: 1. The patient performed treadmill exercise using Bruce protocol, completing 10:38 minutes. The patient completed an estimated workload of 12.8 METS, reaching of the maximum predicted heart rate. Normal hemodynamic response. No stress symptoms reported. Normal exercise capacity. No ischemic changes seen on stress electrocardiogram. 2. The overall quality of the study is good. There is no evidence of abnormal lung activity. Stress and rest SPECT images demonstrate homogeneous tracer distribution throughout the myocardium. Gated SPECT imaging reveals normal myocardial thickening and wall motion. The left ventricular ejection fraction was normal (51%). 3. Low risk study.  Echocardiogram 01/30/2018: Left ventricle cavity is normal in size. Normal global wall motion. Normal diastolic filling pattern. Calculated EF 55%. Left atrial cavity is normal in size. Aneurysmal interatrial septum without PFO. Mild prolapse of the mitral valve leaflets. Moderate (Grade II) mitral regurgitation. Inadequate tricuspid regurgitation jet to estimate pulmonary artery pressure. IVC is dilated with respiratory variation. Estimated RA pressure 8 mmHg.   Physical Exam  Constitutional: He appears well-developed and well-nourished. No distress.  HENT:  Head: Atraumatic.  Eyes: Conjunctivae are normal.  Neck: Neck supple. No JVD present. No thyromegaly present.  Cardiovascular: Normal rate, regular rhythm, S1 normal, S2 normal and intact distal pulses. Exam reveals a midsystolic click. Exam reveals no gallop.  Murmur heard.  Crescendo-decrescendo mid to late systolic murmur is present with a grade of 2/6 at the apex. Pulses:      Carotid pulses are 2+ on the right side and 2+ on the left  side. Pulmonary/Chest: Effort normal and breath sounds normal.  Abdominal: Soft. Bowel sounds are normal.  Musculoskeletal: Normal range of motion.        General: No edema.  Neurological: He is alert.  Skin: Skin is warm and dry.  Psychiatric: He has a normal mood and affect.      Assessment & Recommendations:    1. Atypical chest pain EKG 06/19/2018: Sinus rhythm at rate of 57 bpm, normal axis, incomplete right bundle branch block.  Normal EKG.  2. 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 Family history of early CAD Father CAD2 stents placed at age 24 or 54 years; 4. Dyspnea on exertion  5. Heart burn  6. Smokeless tobacco use.  Laboratory Exam: Labs 12/06/2017: H/H 14/43.  MCV 87.  Platelets 202. Glucose 90.  BUN/creatinine 15/0.91.  EGFR normal.  Sodium 142, potassium 4.8.  Rest of the CMP  normal. Cholesterol 179, triglycerides 68, HDL 52, LDL 113  Recommendation:   Patient seen on an urgent basis for evaluation of ongoing chest pain that started 3 days ago, symptoms are clearly indicated to of GERD and esophageal spasm.  In spite of ongoing chest discomfort, EKG is completely normal, physical exam is completely normal, symptoms are relieved by drinking water and has had no relationship with exertional activity, patient did heavy raking yesterday with no change in symptoms.  Advised him to start taking Protonix on a regular basis, b.i.d. for the next 3 days.  He'll also use Mylanta for the next 3 days 1 tablespoon 3 times a day.  We have discussed regarding the effects of smokeless tobacco use, he uses dip which can cause esophageal spasm and also GERD.  I also discussed regarding increasing cardiovascular risk related to this.  His dyspnea on exertion is probably related to significant GERD.  I do not suspect he has primary cardiovascular or pulmonary issues at this point.  His lipids are slightly elevated with LDL of 113 however we could certainly continue  observation for now.  I advised him to take it easy for the next 3 days, his symptoms do not resolve, he still needs to contact us.  He already has a appointment to see GI, advised him to keep up appointment.  Adrian Prows, MD, [facility] 06/19/2018, 1:24 PM [facility] [place] Pager: [phone] Office: [phone] If no answer Cell [phone]

## 2018-08-14 ENCOUNTER — Other Ambulatory Visit: Payer: Self-pay | Admitting: Cardiology

## 2018-08-14 DIAGNOSIS — I340 Nonrheumatic mitral (valve) insufficiency: Secondary | ICD-10-CM

## 2018-12-19 ENCOUNTER — Other Ambulatory Visit: Payer: Self-pay

## 2018-12-19 ENCOUNTER — Ambulatory Visit (INDEPENDENT_AMBULATORY_CARE_PROVIDER_SITE_OTHER): Payer: 59

## 2018-12-19 DIAGNOSIS — I340 Nonrheumatic mitral (valve) insufficiency: Secondary | ICD-10-CM

## 2018-12-22 NOTE — Progress Notes (Signed)
Follow up visit  Subjective:   Travis Vazquez, male    DOB: 1965/05/23, 53 y.o.   MRN: [id number]   Chief Complaint  Patient presents with  . Chest Pain  . Follow-up    6 month     HPI  53 y/o Caucasian male with moderate mitral regurgitation, former smoker, family history of early CAD.Marland Kitchen.  Since his last visit, he has quit smoking.  He works for [employer] and stays active.  He denies any chest pain or shortness of breath with most aerobic activities.  He has shortness of breath only while pushing or pulling any heavy weights, this is unchanged and does not limit his overall physical activity.   Patient remains concerned given his family history of coronary artery disease in his father.  He recently underwent lipid panel checked through his PCP, results not available.  From his recollection, he tells me LDL was 105 and HDL was "very good".  Past Medical History:  Diagnosis Date  . Chest pain      History reviewed. No pertinent surgical history.   Social History   Socioeconomic History  . Marital status: Single    Spouse name: Not on file  . Number of children: Not on file  . Years of education: Not on file  . Highest education level: Not on file  Occupational History  . Not on file  Social Needs  . Financial resource strain: Not on file  . Food insecurity    Worry: Not on file    Inability: Not on file  . Transportation needs    Medical: Not on file    Non-medical: Not on file  Tobacco Use  . Smoking status: Former Smoker    Packs/day: 0.50    Years: 6.00    Pack years: 3.00    Types: Cigarettes  . Smokeless tobacco: Current User    Types: Chew  Substance and Sexual Activity  . Alcohol use: Never    Frequency: Never  . Drug use: Never  . Sexual activity: Not on file  Lifestyle  . Physical activity    Days per week: Not on file    Minutes per session: Not on file  . Stress: Not on file  Relationships  . Social  Musicianconnections    Talks on phone: Not on file    Gets together: Not on file    Attends religious service: Not on file    Active member of club or organization: Not on file    Attends meetings of clubs or organizations: Not on file    Relationship status: Not on file  . Intimate partner violence    Fear of current or ex partner: Not on file    Emotionally abused: Not on file    Physically abused: Not on file    Forced sexual activity: Not on file  Other Topics Concern  . Not on file  Social History Narrative  . Not on file     Family History  Problem Relation Age of Onset  . Asthma Mother   . Hepatitis C Father   . Heart block Father   . Heart failure Father   . Breast cancer Sister      Current Outpatient Medications on File Prior to Visit  Medication Sig Dispense Refill  . ibuprofen (ADVIL,MOTRIN) 600 MG tablet Take 1 tablet (600 mg total) by mouth every 6 (six) hours as needed. 30 tablet 0  . methocarbamol (ROBAXIN)  500 MG tablet Take 1 tablet (500 mg total) by mouth every 12 (twelve) hours as needed for muscle spasms. (Patient not taking: Reported on 06/19/2018) 9 tablet 0  . Multiple Vitamin (MULTIVITAMIN) capsule Take 1 capsule by mouth daily.    . pantoprazole (PROTONIX) 40 MG tablet Take 1 tablet by mouth as needed.     No current facility-administered medications on file prior to visit.     Cardiovascular studies:  EKG 12/31/2018: Sinus rhythm 67 bpm. RSR(V1) -nondiagnostic.   Echocardiogram 12/22/2018: Left ventricle cavity is normal in size. Normal left ventricular wall thickness. Normal LV systolic function with visual EF 55-60%. Normal global wall motion. Normal diastolic filling pattern.  Mild bileaflet mitral valve prolapse moderate (Grade II) mitral regurgitation. Mild tricuspid regurgitation.  No evidence of pulmonary hypertension. No significant change compared to previous study on 01/30/2018.  Exercise Sestamibi stress test 01/05/2018: 12.8 METS. No  ischemic changes on stress EKG. Normal myocardial perfusion imaging.  Recent labs: Not available   Review of Systems  Constitution: Negative for decreased appetite, malaise/fatigue, weight gain and weight loss.  HENT: Negative for congestion.   Eyes: Negative for visual disturbance.  Cardiovascular: Negative for chest pain, dyspnea on exertion, leg swelling, palpitations and syncope.  Respiratory: Positive for shortness of breath (Only while "pulling or pushing" heavy objects. Stable). Negative for cough.   Endocrine: Negative for cold intolerance.  Hematologic/Lymphatic: Does not bruise/bleed easily.  Skin: Negative for itching and rash.  Musculoskeletal: Negative for myalgias.  Gastrointestinal: Negative for abdominal pain, nausea and vomiting.  Genitourinary: Negative for dysuria.  Neurological: Negative for dizziness and weakness.  Psychiatric/Behavioral: The patient is not nervous/anxious.   All other systems reviewed and are negative.        Vitals:   12/31/18 0939  BP: 118/62  Pulse: 73  Temp: (!) 97.5 F (36.4 C)  SpO2: 98%     Body mass index is 21.87 kg/m. Filed Weights   12/31/18 0939  Weight: 175 lb (79.4 kg)     Objective:   Physical Exam  Constitutional: He is oriented to person, place, and time. He appears well-developed and well-nourished. No distress.  HENT:  Head: Normocephalic and atraumatic.  Eyes: Pupils are equal, round, and reactive to light. Conjunctivae are normal.  Neck: No JVD present.  Cardiovascular: Normal rate, regular rhythm and intact distal pulses.  Murmur heard. High-pitched blowing holosystolic murmur is present with a grade of 1/6 at the apex. Pulmonary/Chest: Effort normal and breath sounds normal. He has no wheezes. He has no rales.  Abdominal: Soft. Bowel sounds are normal. There is no rebound.  Musculoskeletal:        General: No edema.  Lymphadenopathy:    He has no cervical adenopathy.  Neurological: He is alert  and oriented to person, place, and time. No cranial nerve deficit.  Skin: Skin is warm and dry.  Psychiatric: He has a normal mood and affect.  Nursing note and vitals reviewed.         Assessment & Recommendations:   53 y/o Caucasian male with moderate mitral regurgitation  Mitral regurgitation: Primary MR with mitral valve prolapse. Moderate with no change compared to prior study a year ago Will repeat in 1 year  Cardiac risk stratification: Congratulated him on quitting smoking. Given his family history and smoking history, will obtain calcium score.   Nigel Mormon, MD [facility] [place] Pager: [phone] Office: [phone] If no answer Cell [phone]

## 2018-12-25 ENCOUNTER — Other Ambulatory Visit: Payer: 59

## 2018-12-26 ENCOUNTER — Other Ambulatory Visit: Payer: Self-pay | Admitting: Internal Medicine

## 2018-12-26 DIAGNOSIS — R590 Localized enlarged lymph nodes: Secondary | ICD-10-CM

## 2018-12-31 ENCOUNTER — Other Ambulatory Visit: Payer: Self-pay

## 2018-12-31 ENCOUNTER — Encounter: Payer: Self-pay | Admitting: Cardiology

## 2018-12-31 ENCOUNTER — Ambulatory Visit (INDEPENDENT_AMBULATORY_CARE_PROVIDER_SITE_OTHER): Payer: 59 | Admitting: Cardiology

## 2018-12-31 VITALS — BP 118/62 | HR 73 | Temp 97.5°F | Ht 75.0 in | Wt 175.0 lb

## 2018-12-31 DIAGNOSIS — Z8249 Family history of ischemic heart disease and other diseases of the circulatory system: Secondary | ICD-10-CM

## 2018-12-31 DIAGNOSIS — Z720 Tobacco use: Secondary | ICD-10-CM

## 2018-12-31 DIAGNOSIS — I340 Nonrheumatic mitral (valve) insufficiency: Secondary | ICD-10-CM

## 2019-01-30 ENCOUNTER — Other Ambulatory Visit: Payer: 59

## 2019-12-25 ENCOUNTER — Ambulatory Visit: Payer: 59

## 2019-12-25 ENCOUNTER — Other Ambulatory Visit: Payer: Self-pay

## 2019-12-25 DIAGNOSIS — I340 Nonrheumatic mitral (valve) insufficiency: Secondary | ICD-10-CM

## 2020-01-02 ENCOUNTER — Ambulatory Visit: Payer: 59 | Admitting: Cardiology

## 2020-01-02 ENCOUNTER — Encounter: Payer: Self-pay | Admitting: Cardiology

## 2020-01-02 ENCOUNTER — Other Ambulatory Visit: Payer: Self-pay

## 2020-01-02 VITALS — BP 119/75 | HR 57 | Resp 16 | Ht 75.0 in | Wt 176.0 lb

## 2020-01-02 DIAGNOSIS — I340 Nonrheumatic mitral (valve) insufficiency: Secondary | ICD-10-CM

## 2020-01-02 DIAGNOSIS — Z7189 Other specified counseling: Secondary | ICD-10-CM

## 2020-01-02 DIAGNOSIS — Z8249 Family history of ischemic heart disease and other diseases of the circulatory system: Secondary | ICD-10-CM

## 2020-01-02 NOTE — Progress Notes (Signed)
° °Follow up visit ° °Subjective:  ° °Travis Vazquez, male    DOB: 02/08/1966, 54 y.o.   MRN: [id number] ° ° °Chief Complaint  °Patient presents with  °• Moderate mitral regurgitation  °• Follow-up  °  1 year  °• Results  °  echo  ° ° ° °HPI ° °54 y/o Caucasian male with moderate mitral regurgitation, former smoker, family history of early CAD ° °Patient is doing well, denies chest pain, shortness of breath, palpitations, leg edema, orthopnea, PND, TIA/syncope.  He is not undergone calcium score scanning, but interested in doing so.  He reports that he had symptoms of fever, malaise, 3 weeks ago.  He got himself tested with condition increase your test once each, both returned negative.  He is not vaccinated. ° ° ° ° ° °Current Outpatient Medications on File Prior to Visit  °Medication Sig Dispense Refill  °• Multiple Vitamin (MULTIVITAMIN) capsule Take 1 capsule by mouth daily.    °• pantoprazole (PROTONIX) 40 MG tablet Take 1 tablet by mouth as needed.    ° °No current facility-administered medications on file prior to visit.  ° ° °Cardiovascular studies: ° °EKG 01/02/2020: °Sinus rhythm 53 bpm ° °Echocardiogram 12/25/2019:  °Left ventricle cavity is normal in size and wall thickness. Normal global  °wall motion. Normal LV systolic function with EF 60%. Normal diastolic  °filling pattern.  °Left atrial cavity is mildly dilated.  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 Moderate (Grade II) mitral regurgitation.  °Mild tricuspid regurgitation. Estimated pulmonary artery systolic pressure  °24 mmHg.  °No significant change compared to previous study on 12/19/2018. ° °Exercise Sestamibi stress test 01/05/2018: °12.8 METS. °No ischemic changes on stress EKG. °Normal myocardial perfusion imaging. ° °Recent labs: °12/06/2018: °Glucose 87, BUN/Cr 15/0.8. EGFR 101. °Chol 186, TG 77, HDL 63, LDL 108 °TSH 2.5 normal ° ° ° °Review of Systems  °Cardiovascular: Negative for chest pain,  dyspnea on exertion, leg swelling, palpitations and syncope.  ° ° °   ° ° °Vitals:  ° 01/02/20 1003  °BP: 119/75  °Pulse: (!) 57  °Resp: 16  °SpO2: 100%  ° ° ° °Body mass index is 22 kg/m². °Filed Weights  ° 01/02/20 1003  °Weight: 176 lb (79.8 kg)  ° ° ° °Objective:  ° Physical Exam °Vitals and nursing note reviewed.  °Constitutional:   °   General: He is not in acute distress. °Neck:  °   Vascular: No JVD.  °Cardiovascular:  °   Rate and Rhythm: Normal rate and regular rhythm.  °   Heart sounds: Normal heart sounds. No murmur heard.  ° °Pulmonary:  °   Effort: Pulmonary effort is normal.  °   Breath sounds: Normal breath sounds. No wheezing or rales.  ° ° ° ° ° ° °   °Assessment & Recommendations:  ° ° °54 y/o Caucasian male with moderate mitral regurgitation, former smoker, family history of early CAD ° °Mitral regurgitation: ° Moderate, stable. °Repeat echocardiogram in 2 years. ° °Cardiac risk stratification: °Congratulated him on quitting smoking. Given his family history and smoking history, consider calcium score.  ° °I discussed with the patient regarding the risks of Covid infection, hospitalization, and death.  I strongly recommended getting vaccinated against Covid as a safe and FDA approved measure to reduce risk of infection, hospitalization, as well as death. ° °Given that he thinks he may have had complete remission, I have recommended him to undergo antibody testing.    If negative, he should be vaccinated immediately.  If antibody levels are high, he should wait for 8-12 weeks before getting vaccinated.   Nigel Mormon, MD [facility] [place] Pager: [phone] Office: [phone] If no answer Cell [phone]
